# Patient Record
Sex: Female | Born: 1970 | Race: White | Hispanic: No | Marital: Married
Health system: Southern US, Community
[De-identification: ages and names within clinical notes are randomized; demographics above are authoritative.]

## PROBLEM LIST (undated history)

## (undated) HISTORY — PX: AUGMENTATION MAMMAPLASTY: SUR837

---

## 1998-11-10 ENCOUNTER — Other Ambulatory Visit: Admission: RE | Admit: 1998-11-10 | Discharge: 1998-11-10 | Payer: Self-pay | Admitting: Obstetrics and Gynecology

## 1999-05-02 ENCOUNTER — Other Ambulatory Visit: Admission: RE | Admit: 1999-05-02 | Discharge: 1999-05-02 | Payer: Self-pay | Admitting: Obstetrics and Gynecology

## 2000-05-13 ENCOUNTER — Other Ambulatory Visit: Admission: RE | Admit: 2000-05-13 | Discharge: 2000-05-13 | Payer: Self-pay | Admitting: Obstetrics and Gynecology

## 2000-11-04 ENCOUNTER — Other Ambulatory Visit: Admission: RE | Admit: 2000-11-04 | Discharge: 2000-11-04 | Payer: Self-pay | Admitting: Obstetrics and Gynecology

## 2000-12-26 ENCOUNTER — Inpatient Hospital Stay (HOSPITAL_COMMUNITY): Admission: RE | Admit: 2000-12-26 | Discharge: 2000-12-27 | Payer: Self-pay | Admitting: Obstetrics and Gynecology

## 2001-11-26 ENCOUNTER — Inpatient Hospital Stay (HOSPITAL_COMMUNITY): Admission: AD | Admit: 2001-11-26 | Discharge: 2001-11-26 | Payer: Self-pay | Admitting: Obstetrics and Gynecology

## 2001-12-15 ENCOUNTER — Encounter (INDEPENDENT_AMBULATORY_CARE_PROVIDER_SITE_OTHER): Payer: Self-pay

## 2001-12-15 ENCOUNTER — Inpatient Hospital Stay (HOSPITAL_COMMUNITY): Admission: AD | Admit: 2001-12-15 | Discharge: 2001-12-18 | Payer: Self-pay | Admitting: Obstetrics and Gynecology

## 2001-12-20 ENCOUNTER — Encounter: Admission: RE | Admit: 2001-12-20 | Discharge: 2002-01-19 | Payer: Self-pay | Admitting: Obstetrics and Gynecology

## 2002-01-20 ENCOUNTER — Encounter: Admission: RE | Admit: 2002-01-20 | Discharge: 2002-02-19 | Payer: Self-pay | Admitting: Obstetrics and Gynecology

## 2002-01-27 ENCOUNTER — Other Ambulatory Visit: Admission: RE | Admit: 2002-01-27 | Discharge: 2002-01-27 | Payer: Self-pay | Admitting: Obstetrics and Gynecology

## 2002-03-22 ENCOUNTER — Encounter: Admission: RE | Admit: 2002-03-22 | Discharge: 2002-04-21 | Payer: Self-pay | Admitting: Obstetrics and Gynecology

## 2002-05-22 ENCOUNTER — Encounter: Admission: RE | Admit: 2002-05-22 | Discharge: 2002-06-21 | Payer: Self-pay | Admitting: Obstetrics and Gynecology

## 2002-06-22 ENCOUNTER — Encounter: Admission: RE | Admit: 2002-06-22 | Discharge: 2002-07-22 | Payer: Self-pay | Admitting: Obstetrics and Gynecology

## 2002-08-21 ENCOUNTER — Encounter: Admission: RE | Admit: 2002-08-21 | Discharge: 2002-09-20 | Payer: Self-pay | Admitting: Obstetrics and Gynecology

## 2002-10-21 ENCOUNTER — Encounter: Admission: RE | Admit: 2002-10-21 | Discharge: 2002-11-20 | Payer: Self-pay | Admitting: Obstetrics and Gynecology

## 2002-12-21 ENCOUNTER — Encounter: Admission: RE | Admit: 2002-12-21 | Discharge: 2003-01-20 | Payer: Self-pay | Admitting: Obstetrics and Gynecology

## 2003-01-21 ENCOUNTER — Encounter: Admission: RE | Admit: 2003-01-21 | Discharge: 2003-02-20 | Payer: Self-pay | Admitting: Obstetrics and Gynecology

## 2003-02-17 ENCOUNTER — Other Ambulatory Visit: Admission: RE | Admit: 2003-02-17 | Discharge: 2003-02-17 | Payer: Self-pay | Admitting: Obstetrics and Gynecology

## 2003-03-23 ENCOUNTER — Encounter: Admission: RE | Admit: 2003-03-23 | Discharge: 2003-04-22 | Payer: Self-pay | Admitting: Obstetrics and Gynecology

## 2003-05-23 ENCOUNTER — Encounter: Admission: RE | Admit: 2003-05-23 | Discharge: 2003-06-22 | Payer: Self-pay | Admitting: Obstetrics and Gynecology

## 2003-06-23 ENCOUNTER — Encounter: Admission: RE | Admit: 2003-06-23 | Discharge: 2003-07-23 | Payer: Self-pay | Admitting: Obstetrics and Gynecology

## 2004-05-18 ENCOUNTER — Other Ambulatory Visit: Admission: RE | Admit: 2004-05-18 | Discharge: 2004-05-18 | Payer: Self-pay | Admitting: Obstetrics and Gynecology

## 2004-07-25 ENCOUNTER — Ambulatory Visit (HOSPITAL_COMMUNITY): Admission: RE | Admit: 2004-07-25 | Discharge: 2004-07-25 | Payer: Self-pay | Admitting: Obstetrics and Gynecology

## 2004-08-15 ENCOUNTER — Ambulatory Visit (HOSPITAL_COMMUNITY): Admission: RE | Admit: 2004-08-15 | Discharge: 2004-08-15 | Payer: Self-pay | Admitting: Obstetrics and Gynecology

## 2004-08-15 ENCOUNTER — Encounter (INDEPENDENT_AMBULATORY_CARE_PROVIDER_SITE_OTHER): Payer: Self-pay | Admitting: Specialist

## 2005-03-15 ENCOUNTER — Inpatient Hospital Stay (HOSPITAL_COMMUNITY): Admission: AD | Admit: 2005-03-15 | Discharge: 2005-03-15 | Payer: Self-pay | Admitting: Obstetrics & Gynecology

## 2005-05-20 ENCOUNTER — Ambulatory Visit: Payer: Self-pay | Admitting: Cardiology

## 2005-05-23 ENCOUNTER — Ambulatory Visit: Payer: Self-pay | Admitting: Cardiology

## 2005-05-23 ENCOUNTER — Encounter: Payer: Self-pay | Admitting: Cardiology

## 2005-05-23 ENCOUNTER — Ambulatory Visit: Payer: Self-pay

## 2005-07-09 ENCOUNTER — Inpatient Hospital Stay (HOSPITAL_COMMUNITY): Admission: AD | Admit: 2005-07-09 | Discharge: 2005-07-09 | Payer: Self-pay | Admitting: Obstetrics and Gynecology

## 2005-07-15 ENCOUNTER — Inpatient Hospital Stay (HOSPITAL_COMMUNITY): Admission: AD | Admit: 2005-07-15 | Discharge: 2005-07-16 | Payer: Self-pay | Admitting: Obstetrics and Gynecology

## 2005-07-17 ENCOUNTER — Ambulatory Visit: Admission: RE | Admit: 2005-07-17 | Discharge: 2005-07-17 | Payer: Self-pay | Admitting: Obstetrics and Gynecology

## 2008-06-23 ENCOUNTER — Ambulatory Visit (HOSPITAL_COMMUNITY): Admission: RE | Admit: 2008-06-23 | Discharge: 2008-06-23 | Payer: Self-pay | Admitting: Obstetrics and Gynecology

## 2008-06-23 ENCOUNTER — Encounter (INDEPENDENT_AMBULATORY_CARE_PROVIDER_SITE_OTHER): Payer: Self-pay | Admitting: Obstetrics and Gynecology

## 2008-06-26 ENCOUNTER — Inpatient Hospital Stay (HOSPITAL_COMMUNITY): Admission: AD | Admit: 2008-06-26 | Discharge: 2008-06-26 | Payer: Self-pay | Admitting: Obstetrics and Gynecology

## 2009-06-25 ENCOUNTER — Inpatient Hospital Stay (HOSPITAL_COMMUNITY): Admission: AD | Admit: 2009-06-25 | Discharge: 2009-06-25 | Payer: Self-pay | Admitting: Obstetrics and Gynecology

## 2009-07-12 ENCOUNTER — Inpatient Hospital Stay (HOSPITAL_COMMUNITY): Admission: AD | Admit: 2009-07-12 | Discharge: 2009-07-14 | Payer: Self-pay | Admitting: Obstetrics & Gynecology

## 2010-07-22 LAB — CBC
HCT: 31.3 % — ABNORMAL LOW (ref 36.0–46.0)
HCT: 31.4 % — ABNORMAL LOW (ref 36.0–46.0)
Hemoglobin: 10.7 g/dL — ABNORMAL LOW (ref 12.0–15.0)
Hemoglobin: 10.7 g/dL — ABNORMAL LOW (ref 12.0–15.0)
MCHC: 34.2 g/dL (ref 30.0–36.0)
MCHC: 34.2 g/dL (ref 30.0–36.0)
MCV: 93.9 fL (ref 78.0–100.0)
MCV: 94.9 fL (ref 78.0–100.0)
Platelets: 145 10*3/uL — ABNORMAL LOW (ref 150–400)
Platelets: 164 10*3/uL (ref 150–400)
RBC: 3.3 MIL/uL — ABNORMAL LOW (ref 3.87–5.11)
RBC: 3.34 MIL/uL — ABNORMAL LOW (ref 3.87–5.11)
RDW: 12.9 % (ref 11.5–15.5)
RDW: 13.5 % (ref 11.5–15.5)
WBC: 11.4 10*3/uL — ABNORMAL HIGH (ref 4.0–10.5)
WBC: 9.6 10*3/uL (ref 4.0–10.5)

## 2010-07-22 LAB — RPR: RPR Ser Ql: NONREACTIVE

## 2010-08-14 LAB — URINALYSIS, ROUTINE W REFLEX MICROSCOPIC
Bilirubin Urine: NEGATIVE
Glucose, UA: NEGATIVE mg/dL
Hgb urine dipstick: NEGATIVE
Ketones, ur: 15 mg/dL — AB
Nitrite: NEGATIVE
Protein, ur: NEGATIVE mg/dL
Specific Gravity, Urine: 1.025 (ref 1.005–1.030)
Urobilinogen, UA: 0.2 mg/dL (ref 0.0–1.0)
pH: 6 (ref 5.0–8.0)

## 2010-08-14 LAB — CBC
HCT: 32 % — ABNORMAL LOW (ref 36.0–46.0)
HCT: 36.2 % (ref 36.0–46.0)
Hemoglobin: 11.2 g/dL — ABNORMAL LOW (ref 12.0–15.0)
Hemoglobin: 12.3 g/dL (ref 12.0–15.0)
MCHC: 34 g/dL (ref 30.0–36.0)
MCHC: 34.9 g/dL (ref 30.0–36.0)
MCV: 90.6 fL (ref 78.0–100.0)
MCV: 90.7 fL (ref 78.0–100.0)
Platelets: 151 10*3/uL (ref 150–400)
Platelets: 184 10*3/uL (ref 150–400)
RBC: 3.53 MIL/uL — ABNORMAL LOW (ref 3.87–5.11)
RBC: 3.99 MIL/uL (ref 3.87–5.11)
RDW: 11.9 % (ref 11.5–15.5)
RDW: 12.4 % (ref 11.5–15.5)
WBC: 5.3 10*3/uL (ref 4.0–10.5)
WBC: 6.7 10*3/uL (ref 4.0–10.5)

## 2010-08-14 LAB — DIFFERENTIAL
Basophils Absolute: 0 10*3/uL (ref 0.0–0.1)
Basophils Relative: 0 % (ref 0–1)
Eosinophils Absolute: 0 10*3/uL (ref 0.0–0.7)
Eosinophils Relative: 0 % (ref 0–5)
Lymphocytes Relative: 7 % — ABNORMAL LOW (ref 12–46)
Lymphs Abs: 0.5 10*3/uL — ABNORMAL LOW (ref 0.7–4.0)
Monocytes Absolute: 0.4 10*3/uL (ref 0.1–1.0)
Monocytes Relative: 5 % (ref 3–12)
Neutro Abs: 5.8 10*3/uL (ref 1.7–7.7)
Neutrophils Relative %: 87 % — ABNORMAL HIGH (ref 43–77)
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
## 2010-09-11 NOTE — Op Note (Signed)
NAMEJENAVIE, STANCZAK NO.:  [id number]   MEDICAL RECORD NO.:  [id number]          PATIENT TYPE:  AMB   LOCATION:  SDC                           FACILITY:  [facility]   PHYSICIAN:  Juluis Mire, M.D.   DATE OF BIRTH:  03/30/1971   DATE OF PROCEDURE:  06/23/2008  DATE OF DISCHARGE:                               OPERATIVE REPORT   PREOPERATIVE DIAGNOSIS:  Nonviable first trimester pregnancy.   POSTOPERATIVE DIAGNOSIS:  Nonviable first trimester pregnancy.   PROCEDURE:  Dilatation and evacuation.   SURGEON:  Juluis Mire, MD   ANESTHESIA:  General.   ESTIMATED BLOOD LOSS:  100-150 mL.   PACKS AND DRAINS:  None.   INTRAOPERATIVE BLOOD PLACED:  None.   COMPLICATIONS:  None.   INDICATIONS:  Dictated history and physical procedure was followed and  was taken to OR and placed in the supine position.  After satisfactory  level of general anesthesia obtained, the patient was placed in dorsal  spine position using Allen stirrups.  Vagina was cleansed out with  Betadine and draped as sterile field.  A speculum was placed in the  vaginal vault.  The cervix was grasped with single-tooth tenaculum.  Uterus sounded to approximately 10 cm.  Cervix was serially dilated to  size 29 Pratt dilator.  Size 8 curved suction curette was introduced and  true contents were emptied using suction curetting.  This was continued  until no further tissue was obtained.  We then did sharp curetting  followed by suction curetting, followed by sharp curetting with the last  curetting in all quadrants felt to be clear.  The uterus was contracting  down well and bleeding was minimal.  We did send the specimen to  pathology but some was sent for genetics.  At this point in time, the  single-tooth speculum was then removed.  The patient was taken out of  the dorsal position once alert, transferred to recovery in good  condition.  Sponge, instrument, and needle count was correct by  circulating  nurse.      Juluis Mire, M.D.  Electronically Signed     JSM/MEDQ  D:  06/23/2008  T:  06/24/2008  Job:  [id number]

## 2010-09-14 NOTE — H&P (Signed)
NAME:  Regina Shannon, Regina Shannon NO.:  [id number]   MEDICAL RECORD NO.:  [id number]          PATIENT TYPE:  AMB   LOCATION:  SDC                           [facility]ACILITY:  [facility]   PHYSICIAN:  Juluis Mire, M.D.   DATE OF BIRTH:  [birth year]   DATE OF ADMISSION:  DATE OF DISCHARGE:                                HISTORY & PHYSICAL   HISTORY OF PRESENT ILLNESS:  The patient is a 40 year old gravida 3 para 1  abortus 1 married white female, estimated date of confinement of March 03, 2005 giving her an estimated gestational age of [redacted] weeks.   The patient presented to the office for new OB workup on April 17. Fetal  heart tones were not audible. Ultrasound revealed nonviable pregnancy. Fetal  crown-rump length was consistent with 8 weeks 6 days. She now presents for  D&E for management of nonviable first trimester pregnancy. Blood type is B  positive.   ALLERGIES:  She is allergic to SULFA, CODEINE, and MORPHINE.   MEDICATIONS:  Prenatal vitamins.   PAST MEDICAL HISTORY:  Does have a history of endometriosis for which she  has undergone five prior diagnostic laparoscopies. She also has a history of  cervical dysplasia for which she has undergone laser ablation of the cervix  and she reports a loop excision procedure also. Follow-up cytology so far  has been negative.   OBSTETRICAL HISTORY:  She has one spontaneous vaginal delivery, one  spontaneous abortion.   FAMILY HISTORY:  Father has a history of hypertension. Sister with a history  of asthma. There is also a strong history of diabetes.   SOCIAL HISTORY:  No tobacco or alcohol use.   REVIEW OF SYSTEMS:  Noncontributory.   PHYSICAL EXAMINATION:  VITAL SIGNS:  The patient is afebrile with stable  vital signs.  HEENT:  The patient is normocephalic. Pupils equal, round, and reactive to  light and accommodation. Extraocular movements were intact. Sclerae and  conjunctivae were clear, oropharynx clear.  NECK:  Without  thyromegaly.  BREASTS:  No discrete masses.  LUNGS:  Clear.  CARDIOVASCULAR:  Regular rhythm and rate. There are no murmurs or gallops.  ABDOMEN:  Benign. No mass, organomegaly, or tenderness.  PELVIC:  Normal external genitalia, vaginal mucosa is clear. Cervix is  unremarkable. Uterus 8-9 weeks in size. Adnexa unremarkable.  EXTREMITIES:  Trace edema.  NEUROLOGIC:  Grossly within normal limits.   IMPRESSION:  Nonviable first trimester pregnancy.   PLAN:  The patient to undergo dilatation and evacuation. The risks of the  procedure have been discussed including the risk of infection, the risk of  hemorrhage that could require transfusion with the risk of AIDS or  hepatitis, the risk of injury to adjacent organs through perforation that  could require exploratory surgery, the risk of deep venous thrombosis and  pulmonary embolus. The patient professes understanding of indications and  risk and accepting of them.      JSM/MEDQ  D:  08/15/2004  T:  08/15/2004  Job:  [id number]

## 2010-09-14 NOTE — Op Note (Signed)
[facility]  Patient:    Regina Shannon, Regina Shannon Visit Number: [id number] MRN: [id number]          Service Type: DSU Location: DAY Attending Physician:  Frederich Balding Dictated by:   Juluis Mire, M.D. Proc. Date: 12/26/00 Admit Date:  12/26/2000                             Operative Report  PREOPERATIVE DIAGNOSIS:  Pelvic endometriosis.  POSTOPERATIVE DIAGNOSIS:  Pelvic endometriosis.  OPERATION PERFORMED:  Open laparoscopy with laser oblation of endometriotic implants.  SURGEON:  Juluis Mire, M.D.  ANESTHESIA:  General endotracheal.  ESTIMATED BLOOD LOSS:  Minimal.  PACKS/DRAINS:  None.  INTRAOPERATIVE BLOOD REPLACED:  None.  COMPLICATIONS:  None.  INDICATIONS FOR PROCEDURE:  Dictated in the history and physical.  DESCRIPTION OF PROCEDURE:  The patient was taken to the OR, placed in supine position. After a satisfactory level of general endotracheal anesthesia was obtained, the patient was placed in the dorsal lithotomy position using the Allen stirrups. The abdomen, perineum and vagina were prepped with Betadine. The bladder was in catheterization. A Hulka tenaculum was put in place and secured. The patient was draped out for laparoscopy. A subumbilical incision was made with a knife. The incision was extended through the subcutaneous tissue. The fascia was entered sharply, the incision extended laterally. The rectus muscles were separately, peritoneum was entered sharply. Palpation around the umbilicus revealed no evidence of any adhesive process. Two lateral sutures in the fascia were put in place with #0 Vicryl. These were used to secure the Hasson cannula. The abdomen was then inflated with carbon dioxide. The laparoscope was then introduced. The 5 mm trocar was put in place in the suprapubic area under direct visualization. There was no evidence of injury to adjacent organs. The appendix was visualized and noted to be normal.  The upper abdomen including the liver and tip of the gallbladder were clear. The uterus was of ml size and shape. The tubes and ovaries were unremarkable. She had numerous ulcerative type plaques of endometriosis throughout the cul-de-sac, both uterosacral ligaments on the fundal area of the uterus and right anterior abdominal wall. There were no adhesive processes noted. Both tubes and ovaries appear to be normal. Using the YAG laser with the rounded saphire tip, all areas of endometriosis were oblated. This would be considered minimal to mild amounts of endometriosis by the [employer] classifications. At the end of the procedure, all active areas of endometriosis were oblated. There was no evidence of injury to adjacent organs. Irrigation was used copiously. Some irrigation was left in place. The abdomen was deflated with some carbon dioxide. All trocars removed. Subumbilical fascia was closed with a figure-of-eight of #0 Vicryl. The skin was closed with interrupted subcuticulars of 4-0 Vicryl. The suprapubic incision was closed with Steri-Strips. The Hulka tenaculum was then removed. The patient was taken out of the dorsal lithotomy position. Once alert and extubated transferred to the recovery room in good condition. Sponge, needle and instrument counts reported as correct by the circulating nurse x 2. Dictated by:   Juluis Mire, M.D. Attending Physician:  Frederich Balding DD:  12/26/00 TD:  12/27/00 Job: [id number]

## 2010-09-14 NOTE — Op Note (Signed)
Regina Shannon, SPERANZA NO.:  [id number]   MEDICAL RECORD NO.:  [id number]          PATIENT TYPE:  AMB   LOCATION:  SDC                           FACILITY:  [facility]   PHYSICIAN:  Juluis Mire, M.D.   DATE OF BIRTH:  11/09/1970   DATE OF PROCEDURE:  08/15/2004  DATE OF DISCHARGE:                                 OPERATIVE REPORT   PREOPERATIVE DIAGNOSIS:  Nonviable first trimester pregnancy.   POSTOPERATIVE DIAGNOSIS:  Nonviable first trimester pregnancy.   OPERATIVE PROCEDURES:  1. Paracervical block.  2. Dilatation and evacuation.     SURGEON:  Juluis Mire, M.D.   ANESTHESIA:  Sedation with paracervical block.   ESTIMATED BLOOD LOSS:  Minimal.   PACKS AND DRAINS:  None.   INTRAOPERATIVE BLOOD REPLACED:  None.   COMPLICATIONS:  None.   INDICATIONS:  Dictated in the history and physical.   The procedure is as follows:  The patient was taken to the OR and placed in  the supine position.  After sedation, was placed in the dorsal lithotomy  position using the Allen stirrups.  The patient draped as a sterile field.  A speculum was placed in the vaginal vault.  The cervix and vagina cleansed  out with Betadine.  A paracervical block was instituted using 1% Nesacaine.  The cervix was grasped with a single-tooth tenaculum and the uterus sounded  to approximately 10-11 cm.  The cervix was serially dilated to a size 29  Pratt dilator.  A size 8 curved suction curette was introduced and the  intrauterine cavity was evacuated using suction curette, and multiple passes  were obtained until no additional tissue was obtained.  At this point a  sharp curette and additional suction __________ revealed no  additional tissue, and all quadrants were felt to be clear.  The uterus was  contracting down well.  There was minimal bleeding.  Sponge, instrument and  needle count were reported as correct by the circulating nurse.  The patient  was taken out of the dorsal  lithotomy position and once alert, transferred  to the recovery room in good condition.      JSM/MEDQ  D:  08/15/2004  T:  08/15/2004  Job:  [id number]

## 2010-09-14 NOTE — H&P (Signed)
[facility]  Patient:    Regina Shannon, Regina Shannon Visit Number: [id number] MRN: [id number]          Service Type: Attending:  Juluis Mire, M.D. Dictated by:   Juluis Mire, M.D. Adm. Date:  12/26/00                           History and Physical  HISTORY OF PRESENT ILLNESS:  The patient is a 40 year old nulligravida white female who presents for laparoscopic evaluation of possible recurrent pelvic endometriosis.  In relation to the present admission, the patient was originally seen in our practice in 2000.  The patient had a long-standing history of endometriosis at that time.  She had undergone previous laparoscopies in 1995 and 1997 for active treatment of endometriosis and has subsequently been treated with Depo-Provera as well as Lupron Depot.  At the time of her initial visit, she was on continuous birth control pills.  She subsequently has married and had been having trouble with increasing pelvic pain and discomfort, particularly with intercourse.  She is contemplating a future pregnancy.  Subsequent ultrasound evaluations have been done in our office, which were basically unremarkable, indicating no evidence of any recurrent type of pelvic process.  However, because of worsening pain and discomfort, we felt that she probably has recurrent endometriosis, and now she presents for repeat laparoscopy at this time.  ALLERGIES:  SULFA.  MEDICATIONS:  Vitamins at the present time.  Also uses Allegra for allergies.  PAST MEDICAL HISTORY:  Usual childhood diseases, no significant sequelae. Previous history of cervical dysplasia, treated with laser ablation.  PAST SURGICAL HISTORY:  In 1995, she had a laparoscopy with finding of endometriosis.  In 1996, she had laparoscopic evaluation with finding of endometriosis.  In 1997 she also had laparoscopy with treatment of active endometriosis.  OBSTETRICAL HISTORY:  No obstetrical history.  FAMILY HISTORY:   Strong family history of diabetes on her fathers side of the family.  History of hypertension in her father also.  Maternal grandmother with lung and liver cancer.  Sister has a history of asthma.  REVIEW OF SYSTEMS:  Noncontributory.  SOCIAL HISTORY:  No tobacco or alcohol use.  PHYSICAL EXAMINATION:  VITAL SIGNS:  The patient is afebrile with stable vital signs.  HEENT:  Patient normocephalic.  Pupils equal, round and reactive to light and accommodation.  Extraocular movements were intact.  Sclerae and conjunctivae were clear.  Oropharynx clear.  NECK:  Without thyromegaly.  BREASTS:  No discrete masses.  CHEST:  Lungs clear.  CARDIAC:  Regular rate, no murmurs or gallops.  ABDOMEN:  Benign.  No mass, organomegaly, or tenderness.  PELVIC:  Normal external genitalia.  Vaginal mucosa clear.  Cervix unremarkable.  Uterus is midposition, moderate cul-de-sac tenderness.  Adnexa unremarkable.  EXTREMITIES:  Trace edema.  NEUROLOGIC:  Grossly within normal limits.  IMPRESSION:  Probable recurrent endometriosis.  PLAN:  The patient will undergo repeat laparoscopy with laser standby for evaluation.  The risks of surgery have been discussed, including the risk of anesthesia.  The risk of infection.  The risk of vascular injury that could require transfusion with the risk of AIDS or hepatitis and possible exploratory surgery.  The risk of injury to adjacent organs, including bladder, bowel, or ureters that could require further exploratory surgery. The risk of deep venous thrombosis and pulmonary embolus.  The patient expressed an understanding of the indications and risks. Dictated by:   Raphael Gibney.  McComb, M.D. Attending:  Juluis Mire, M.D. DD:  12/26/00 TD:  12/26/00 Job: [id number]

## 2011-08-12 DIAGNOSIS — J309 Allergic rhinitis, unspecified: Secondary | ICD-10-CM | POA: Insufficient documentation

## 2012-12-22 DIAGNOSIS — F329 Major depressive disorder, single episode, unspecified: Secondary | ICD-10-CM | POA: Insufficient documentation

## 2012-12-22 DIAGNOSIS — F411 Generalized anxiety disorder: Secondary | ICD-10-CM | POA: Insufficient documentation

## 2012-12-22 DIAGNOSIS — F32A Depression, unspecified: Secondary | ICD-10-CM | POA: Insufficient documentation

## 2014-07-19 DIAGNOSIS — J329 Chronic sinusitis, unspecified: Secondary | ICD-10-CM | POA: Insufficient documentation

## 2014-08-24 ENCOUNTER — Other Ambulatory Visit: Payer: Self-pay | Admitting: Obstetrics and Gynecology

## 2014-08-25 LAB — CYTOLOGY - PAP

## 2014-09-12 ENCOUNTER — Other Ambulatory Visit: Payer: Self-pay | Admitting: Orthopedic Surgery

## 2014-09-12 DIAGNOSIS — G8929 Other chronic pain: Secondary | ICD-10-CM

## 2014-09-12 DIAGNOSIS — M533 Sacrococcygeal disorders, not elsewhere classified: Principal | ICD-10-CM

## 2014-09-14 ENCOUNTER — Ambulatory Visit
Admission: RE | Admit: 2014-09-14 | Discharge: 2014-09-14 | Disposition: A | Discharge status: Home / Self Care | Payer: Worker's Compensation | Source: Ambulatory Visit | Attending: Orthopedic Surgery | Admitting: Orthopedic Surgery

## 2014-09-14 DIAGNOSIS — G8929 Other chronic pain: Secondary | ICD-10-CM

## 2014-09-14 DIAGNOSIS — M533 Sacrococcygeal disorders, not elsewhere classified: Principal | ICD-10-CM

## 2014-09-14 MED ORDER — IOHEXOL 180 MG/ML  SOLN
1.0000 mL | Freq: Once | INTRAMUSCULAR | Status: AC | PRN
  Administered 2014-09-14: 1 mL via INTRA_ARTICULAR

## 2014-09-14 MED ORDER — METHYLPREDNISOLONE ACETATE 40 MG/ML INJ SUSP (RADIOLOG
120.0000 mg | Freq: Once | INTRAMUSCULAR | Status: AC
  Administered 2014-09-14: 120 mg via INTRA_ARTICULAR

## 2014-09-14 NOTE — Discharge Instructions (Signed)

## 2014-11-17 ENCOUNTER — Other Ambulatory Visit: Payer: Self-pay | Admitting: Orthopedic Surgery

## 2014-11-17 DIAGNOSIS — M533 Sacrococcygeal disorders, not elsewhere classified: Secondary | ICD-10-CM

## 2014-11-22 ENCOUNTER — Ambulatory Visit
Admission: RE | Admit: 2014-11-22 | Discharge: 2014-11-22 | Disposition: A | Discharge status: Home / Self Care | Payer: Worker's Compensation | Source: Ambulatory Visit | Attending: Orthopedic Surgery | Admitting: Orthopedic Surgery

## 2014-11-22 DIAGNOSIS — M533 Sacrococcygeal disorders, not elsewhere classified: Secondary | ICD-10-CM

## 2014-11-22 MED ORDER — METHYLPREDNISOLONE ACETATE 40 MG/ML INJ SUSP (RADIOLOG
120.0000 mg | Freq: Once | INTRAMUSCULAR | Status: AC
  Administered 2014-11-22: 120 mg via EPIDURAL

## 2014-11-22 MED ORDER — IOHEXOL 180 MG/ML  SOLN
1.0000 mL | Freq: Once | INTRAMUSCULAR | Status: AC | PRN
  Administered 2014-11-22: 1 mL via EPIDURAL

## 2014-11-22 NOTE — Discharge Instructions (Signed)

## 2015-01-16 ENCOUNTER — Other Ambulatory Visit (INDEPENDENT_AMBULATORY_CARE_PROVIDER_SITE_OTHER): Payer: Self-pay | Admitting: Otolaryngology

## 2015-01-16 DIAGNOSIS — R221 Localized swelling, mass and lump, neck: Secondary | ICD-10-CM

## 2015-01-18 ENCOUNTER — Ambulatory Visit
Admission: RE | Admit: 2015-01-18 | Discharge: 2015-01-18 | Disposition: A | Discharge status: Home / Self Care | Payer: 59 | Source: Ambulatory Visit | Attending: Otolaryngology | Admitting: Otolaryngology

## 2015-01-18 DIAGNOSIS — R221 Localized swelling, mass and lump, neck: Secondary | ICD-10-CM

## 2015-01-18 MED ORDER — IOPAMIDOL (ISOVUE-300) INJECTION 61%
75.0000 mL | Freq: Once | INTRAVENOUS | Status: AC | PRN
  Administered 2015-01-18: 75 mL via INTRAVENOUS

## 2015-01-23 ENCOUNTER — Other Ambulatory Visit (INDEPENDENT_AMBULATORY_CARE_PROVIDER_SITE_OTHER): Payer: Self-pay | Admitting: Otolaryngology

## 2015-01-23 DIAGNOSIS — E041 Nontoxic single thyroid nodule: Secondary | ICD-10-CM

## 2015-01-24 ENCOUNTER — Inpatient Hospital Stay
Admission: RE | Admit: 2015-01-24 | Discharge: 2015-01-24 | Disposition: A | Discharge status: Home / Self Care | Payer: Self-pay | Source: Ambulatory Visit | Attending: Otolaryngology | Admitting: Otolaryngology

## 2015-01-24 ENCOUNTER — Other Ambulatory Visit (INDEPENDENT_AMBULATORY_CARE_PROVIDER_SITE_OTHER): Payer: Self-pay | Admitting: Otolaryngology

## 2015-01-24 DIAGNOSIS — E041 Nontoxic single thyroid nodule: Secondary | ICD-10-CM

## 2015-09-27 ENCOUNTER — Other Ambulatory Visit: Payer: Self-pay | Admitting: Orthopedic Surgery

## 2015-09-27 DIAGNOSIS — M5442 Lumbago with sciatica, left side: Secondary | ICD-10-CM

## 2015-10-09 ENCOUNTER — Ambulatory Visit
Admission: RE | Admit: 2015-10-09 | Discharge: 2015-10-09 | Disposition: A | Discharge status: Home / Self Care | Payer: Worker's Compensation | Source: Ambulatory Visit | Attending: Orthopedic Surgery | Admitting: Orthopedic Surgery

## 2015-10-09 DIAGNOSIS — M5442 Lumbago with sciatica, left side: Secondary | ICD-10-CM

## 2015-10-09 MED ORDER — IOPAMIDOL (ISOVUE-M 200) INJECTION 41%
1.0000 mL | Freq: Once | INTRAMUSCULAR | Status: AC
  Administered 2015-10-09: 1 mL via EPIDURAL

## 2015-10-09 MED ORDER — METHYLPREDNISOLONE ACETATE 40 MG/ML INJ SUSP (RADIOLOG
120.0000 mg | Freq: Once | INTRAMUSCULAR | Status: AC
  Administered 2015-10-09: 120 mg via EPIDURAL

## 2015-10-09 NOTE — Discharge Instructions (Signed)

## 2015-12-20 ENCOUNTER — Other Ambulatory Visit: Payer: Self-pay | Admitting: Orthopedic Surgery

## 2015-12-20 DIAGNOSIS — G8929 Other chronic pain: Secondary | ICD-10-CM

## 2015-12-20 DIAGNOSIS — M545 Low back pain: Principal | ICD-10-CM

## 2015-12-29 ENCOUNTER — Ambulatory Visit
Admission: RE | Admit: 2015-12-29 | Discharge: 2015-12-29 | Disposition: A | Discharge status: Home / Self Care | Payer: Worker's Compensation | Source: Ambulatory Visit | Attending: Orthopedic Surgery | Admitting: Orthopedic Surgery

## 2015-12-29 DIAGNOSIS — G8929 Other chronic pain: Secondary | ICD-10-CM

## 2015-12-29 DIAGNOSIS — M545 Low back pain: Principal | ICD-10-CM

## 2016-01-15 ENCOUNTER — Other Ambulatory Visit (INDEPENDENT_AMBULATORY_CARE_PROVIDER_SITE_OTHER): Payer: Self-pay | Admitting: Otolaryngology

## 2017-02-07 ENCOUNTER — Other Ambulatory Visit: Payer: Self-pay | Admitting: Obstetrics and Gynecology

## 2017-02-07 DIAGNOSIS — R928 Other abnormal and inconclusive findings on diagnostic imaging of breast: Secondary | ICD-10-CM

## 2017-02-11 ENCOUNTER — Other Ambulatory Visit: Payer: Self-pay

## 2017-02-13 ENCOUNTER — Ambulatory Visit
Admission: RE | Admit: 2017-02-13 | Discharge: 2017-02-13 | Disposition: A | Discharge status: Home / Self Care | Payer: BLUE CROSS/BLUE SHIELD | Source: Ambulatory Visit | Attending: Obstetrics and Gynecology | Admitting: Obstetrics and Gynecology

## 2017-02-13 ENCOUNTER — Ambulatory Visit: Payer: Self-pay

## 2017-02-13 DIAGNOSIS — R928 Other abnormal and inconclusive findings on diagnostic imaging of breast: Secondary | ICD-10-CM

## 2017-03-19 ENCOUNTER — Other Ambulatory Visit: Payer: Self-pay | Admitting: Orthopedic Surgery

## 2017-03-19 DIAGNOSIS — M533 Sacrococcygeal disorders, not elsewhere classified: Secondary | ICD-10-CM

## 2017-04-02 ENCOUNTER — Ambulatory Visit
Admission: RE | Admit: 2017-04-02 | Discharge: 2017-04-02 | Disposition: A | Discharge status: Home / Self Care | Payer: BLUE CROSS/BLUE SHIELD | Source: Ambulatory Visit | Attending: Orthopedic Surgery | Admitting: Orthopedic Surgery

## 2017-04-02 ENCOUNTER — Ambulatory Visit
Admission: RE | Admit: 2017-04-02 | Discharge: 2017-04-02 | Disposition: A | Discharge status: Home / Self Care | Payer: Worker's Compensation | Source: Ambulatory Visit | Attending: Orthopedic Surgery | Admitting: Orthopedic Surgery

## 2017-04-02 DIAGNOSIS — M533 Sacrococcygeal disorders, not elsewhere classified: Secondary | ICD-10-CM

## 2019-03-02 IMAGING — MG 2D DIGITAL DIAGNOSTIC UNILATERAL RIGHT MAMMOGRAM WITH CAD AND AD
6 series · 6 of 14 positions shown · non-contrast
Comparison: Previous exams including recent screening mammogram
dated 02/06/2017

CLINICAL DATA: Patient returns today to evaluate a possible right
breast asymmetry questioned on recent screening mammogram.

EXAM:
2D DIGITAL DIAGNOSTIC UNILATERAL RIGHT MAMMOGRAM WITH CAD AND
ADJUNCT TOMO

[R MLO]
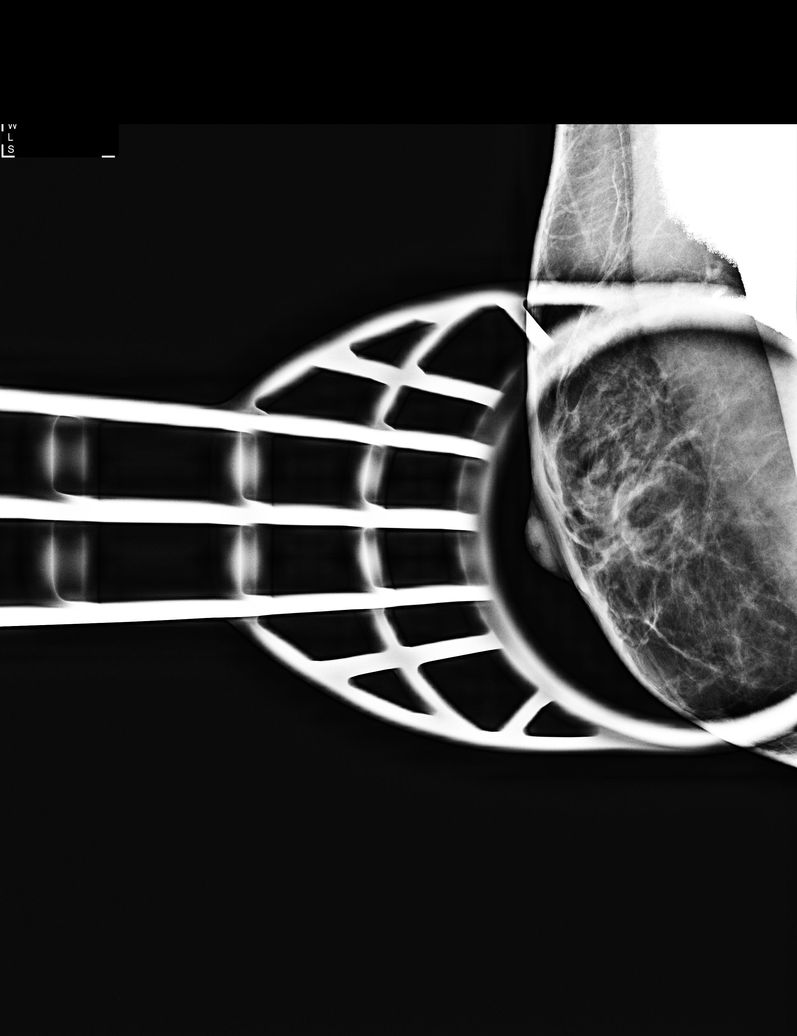

[R ML synth-2D]
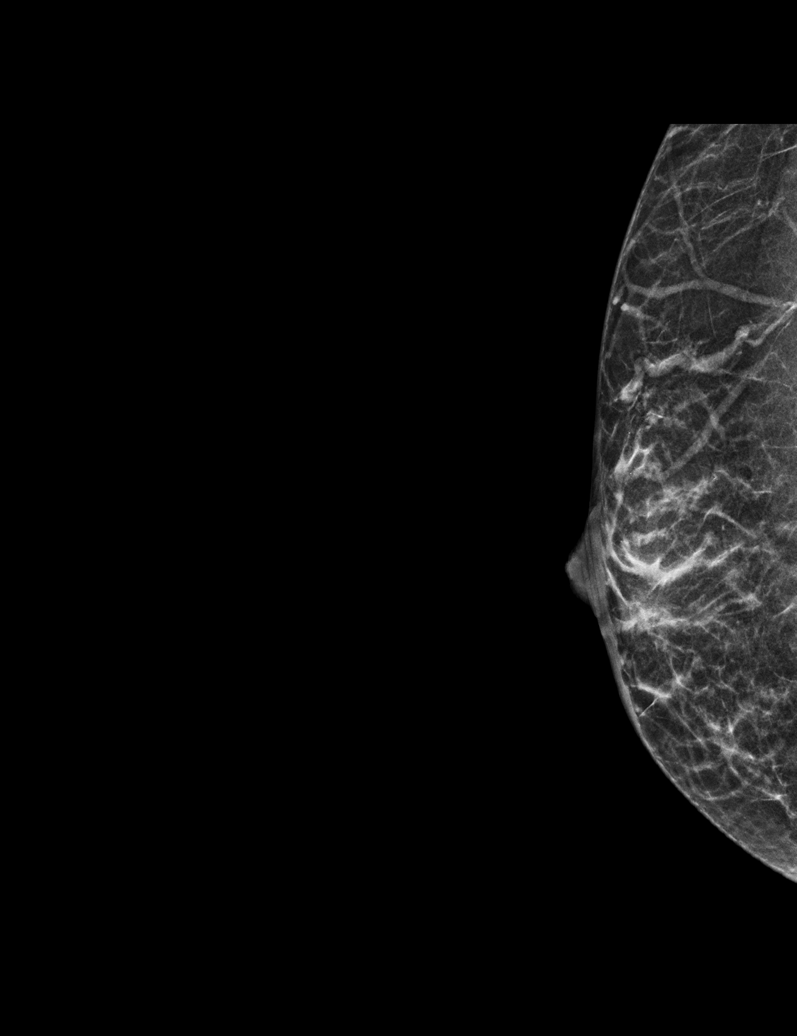

[R MLO synth-2D]
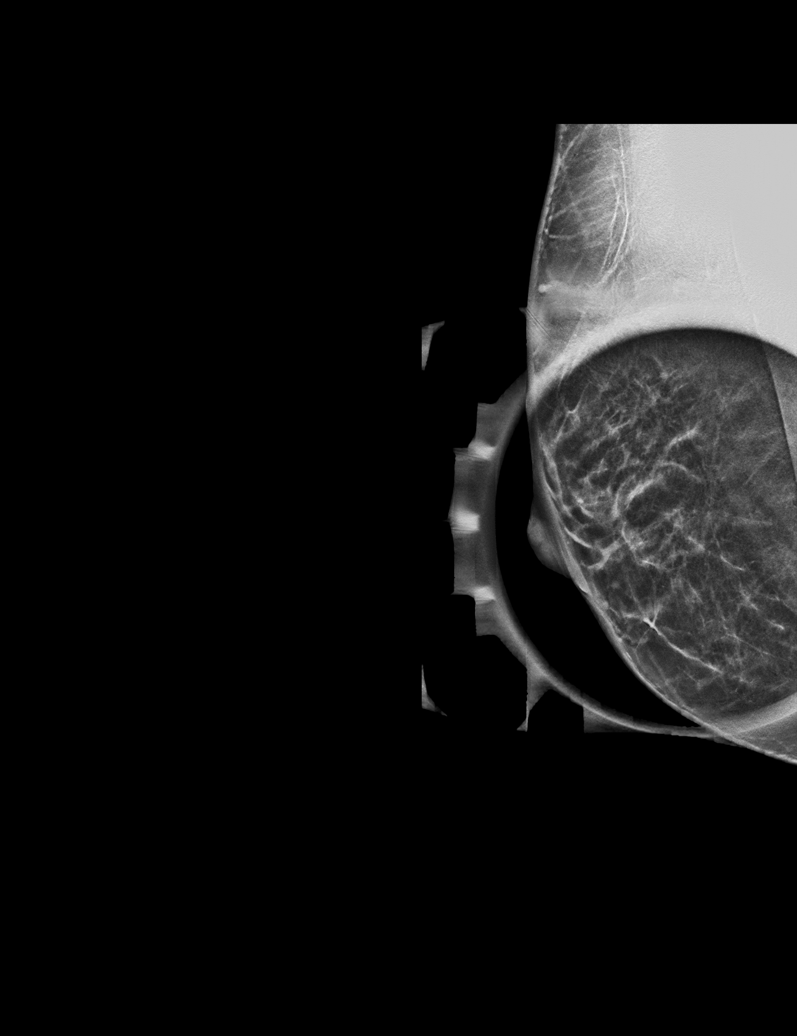

[R ML]
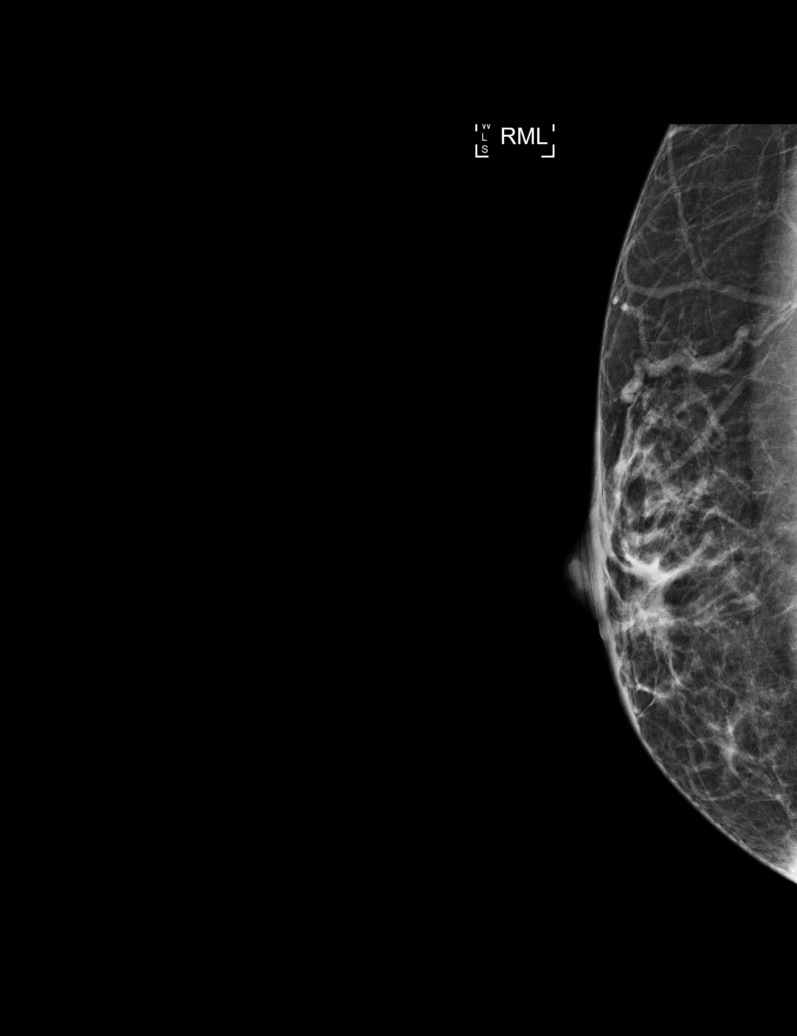

[R MLO tomo · tomo slice 19/36.0]
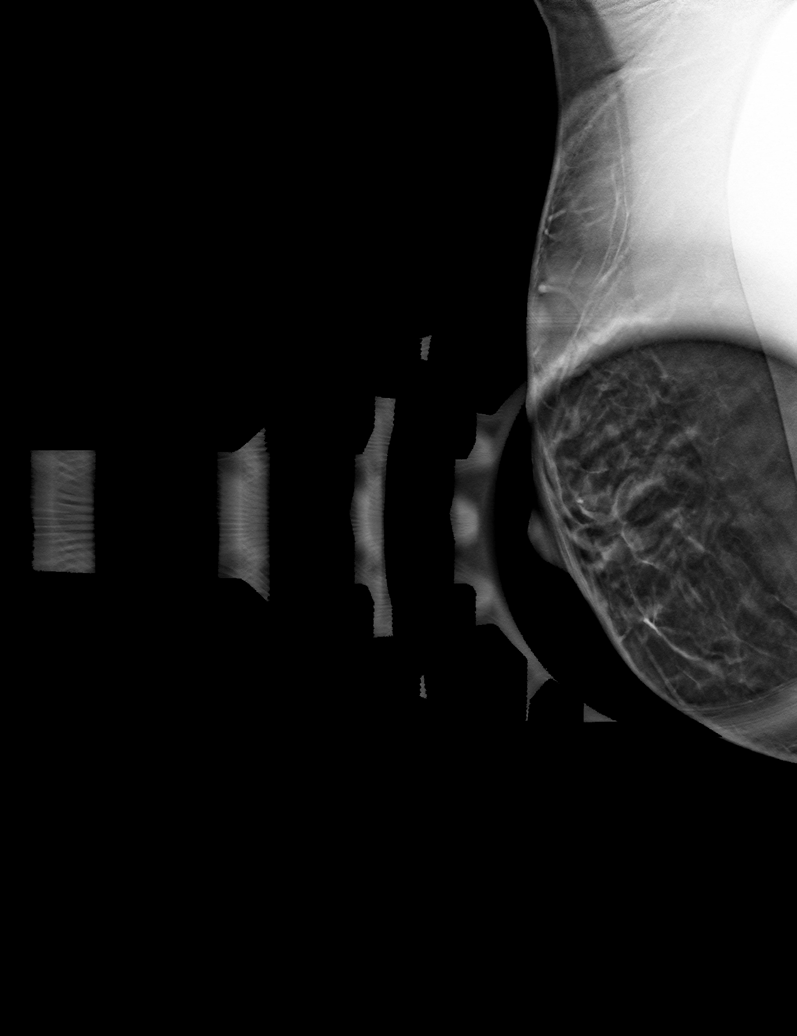

[R ML tomo · tomo slice 17/34.0]
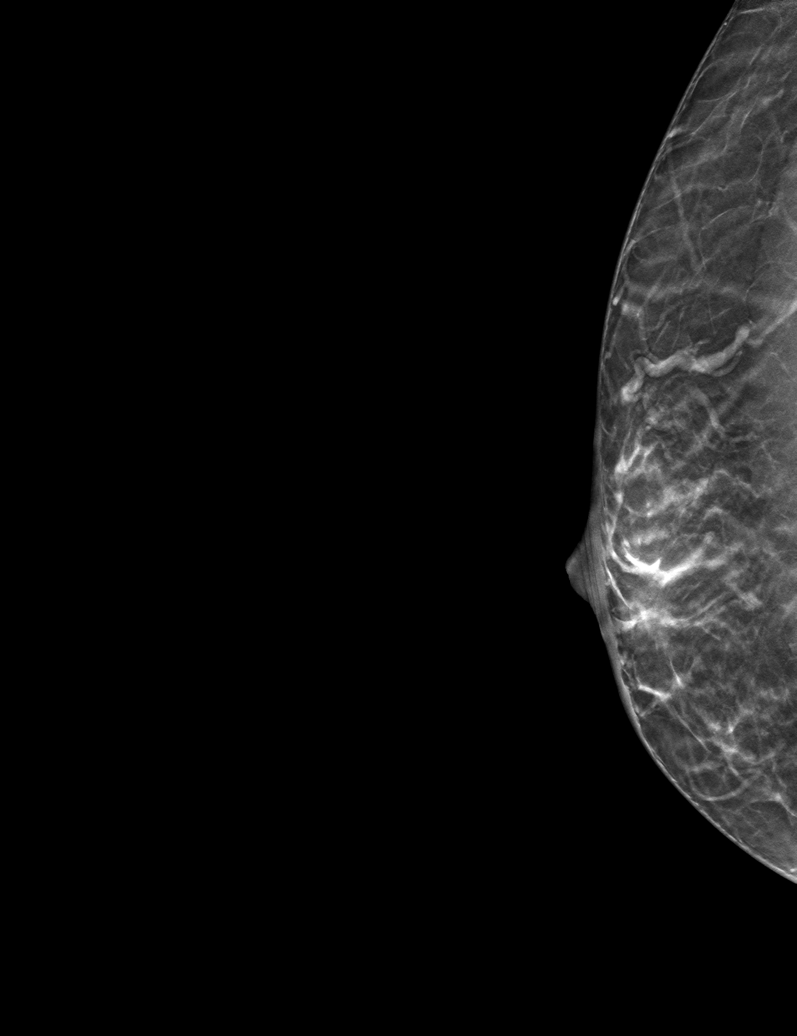

[6 of 14 positions shown; findings below may reference images not displayed]

ACR Breast Density Category c: The breast tissue is heterogeneously
dense, which may obscure small masses.
FINDINGS: On today's additional diagnostic views with spot compression and 3D
tomosynthesis, there is no persistent asymmetry within the right
breast indicating superimposition of normal dense fibroglandular
tissues. There are no new masses, suspicious calcifications or
secondary signs of malignancy identified.

Mammographic images were processed with CAD.
IMPRESSION: No evidence of malignancy. Patient may return to routine annual
bilateral screening mammogram schedule.

RECOMMENDATION:
Screening mammogram in one year.(Code:[id number]-W-3J9)

I have discussed the findings and recommendations with the patient.
Results were also provided in writing at the conclusion of the
visit. If applicable, a reminder letter will be sent to the patient
regarding the next appointment.

BI-RADS CATEGORY  1: Negative.
